# Patient Record
Sex: Female | Born: 1950 | Race: White | Hispanic: No | Marital: Married | Smoking: Current every day smoker
Health system: Southern US, Community
[De-identification: ages and names within clinical notes are randomized; demographics above are authoritative.]

## PROBLEM LIST (undated history)

## (undated) DIAGNOSIS — I10 Essential (primary) hypertension: Secondary | ICD-10-CM

## (undated) HISTORY — PX: CARDIAC SURGERY: SHX584

---

## 2015-09-29 ENCOUNTER — Emergency Department (HOSPITAL_BASED_OUTPATIENT_CLINIC_OR_DEPARTMENT_OTHER): Payer: 59

## 2015-09-29 ENCOUNTER — Encounter (HOSPITAL_BASED_OUTPATIENT_CLINIC_OR_DEPARTMENT_OTHER): Payer: Self-pay | Admitting: Emergency Medicine

## 2015-09-29 ENCOUNTER — Emergency Department (HOSPITAL_BASED_OUTPATIENT_CLINIC_OR_DEPARTMENT_OTHER)
Admission: EM | Admit: 2015-09-29 | Discharge: 2015-09-29 | Disposition: A | Discharge status: Home / Self Care | Payer: 59 | Attending: Emergency Medicine | Admitting: Emergency Medicine

## 2015-09-29 DIAGNOSIS — Y998 Other external cause status: Secondary | ICD-10-CM | POA: Insufficient documentation

## 2015-09-29 DIAGNOSIS — F17210 Nicotine dependence, cigarettes, uncomplicated: Secondary | ICD-10-CM | POA: Insufficient documentation

## 2015-09-29 DIAGNOSIS — Y9289 Other specified places as the place of occurrence of the external cause: Secondary | ICD-10-CM | POA: Diagnosis not present

## 2015-09-29 DIAGNOSIS — Z79899 Other long term (current) drug therapy: Secondary | ICD-10-CM | POA: Diagnosis not present

## 2015-09-29 DIAGNOSIS — W19XXXA Unspecified fall, initial encounter: Secondary | ICD-10-CM

## 2015-09-29 DIAGNOSIS — S52122A Displaced fracture of head of left radius, initial encounter for closed fracture: Secondary | ICD-10-CM | POA: Insufficient documentation

## 2015-09-29 DIAGNOSIS — Y9389 Activity, other specified: Secondary | ICD-10-CM | POA: Insufficient documentation

## 2015-09-29 DIAGNOSIS — S59902A Unspecified injury of left elbow, initial encounter: Secondary | ICD-10-CM | POA: Diagnosis present

## 2015-09-29 DIAGNOSIS — I10 Essential (primary) hypertension: Secondary | ICD-10-CM | POA: Insufficient documentation

## 2015-09-29 DIAGNOSIS — W010XXA Fall on same level from slipping, tripping and stumbling without subsequent striking against object, initial encounter: Secondary | ICD-10-CM | POA: Insufficient documentation

## 2015-09-29 HISTORY — DX: Essential (primary) hypertension: I10

## 2015-09-29 MED ORDER — HYDROCODONE-ACETAMINOPHEN 5-325 MG PO TABS
1.0000 | ORAL_TABLET | Freq: Once | ORAL | Status: AC
  Administered 2015-09-29: 1 via ORAL
  Filled 2015-09-29: qty 1

## 2015-09-29 MED ORDER — HYDROCODONE-ACETAMINOPHEN 5-325 MG PO TABS: 1.0000 | ORAL_TABLET | Freq: Four times a day (QID) | ORAL | Status: AC | PRN

## 2015-09-29 NOTE — ED Notes (Signed)
Pt reports that she tripped over her dog last night and fell onto right arm

## 2015-09-29 NOTE — Discharge Instructions (Signed)
X-ray show evidence of a left radial head fracture. Keep the sling on at all times. Take pain medicine as directed. Make an appointment to follow-up with sports medicine upstairs.

## 2015-09-29 NOTE — ED Provider Notes (Signed)
CSN: [id number]     Arrival date & time 09/29/15  1219 History   First MD Initiated Contact with Patient 09/29/15 1405     Chief Complaint  Patient presents with  . Fall     (Consider location/radiation/quality/duration/timing/severity/associated sxs/prior Treatment) Patient is a 64 y.o. female presenting with fall. The history is provided by the patient and the spouse.  Fall Pertinent negatives include no chest pain, no abdominal pain, no headaches and no shortness of breath.   patient status post fall last evening when she tripped over her dog. No loss of consciousness. Landed on her outstretched left arm immediate pain to the elbow area. Now has pain radiating from the elbow up to the shoulder and down to the hand. No other injuries. Pain is 10 out of 10. Difficult to move the arm at the elbow.  Past Medical History  Diagnosis Date  . Hypertension    Past Surgical History  Procedure Laterality Date  . Cardiac surgery     History reviewed. No pertinent family history. Social History  Substance Use Topics  . Smoking status: Current Every Day Smoker    Types: Cigarettes  . Smokeless tobacco: None  . Alcohol Use: No   OB History    No data available     Review of Systems  Constitutional: Negative for fever.  HENT: Negative for congestion.   Eyes: Negative for redness.  Respiratory: Negative for shortness of breath.   Cardiovascular: Negative for chest pain.  Gastrointestinal: Negative for abdominal pain.  Genitourinary: Negative for dysuria.  Musculoskeletal: Negative for back pain and neck pain.  Skin: Negative for rash.  Neurological: Negative for headaches.  Hematological: Does not bruise/bleed easily.  Psychiatric/Behavioral: Negative for confusion.      Allergies  Review of patient's allergies indicates no known allergies.  Home Medications   Prior to Admission medications   Medication Sig Start Date End Date Taking? Authorizing Provider  lisinopril  (PRINIVIL,ZESTRIL) 10 MG tablet Take 10 mg by mouth daily.   Yes Historical Provider, MD  HYDROcodone-acetaminophen (NORCO/VICODIN) 5-325 MG tablet Take 1-2 tablets by mouth every 6 (six) hours as needed for moderate pain. 09/29/15   Vanetta Mulders, MD   BP 141/80 mmHg  Pulse 91  Temp(Src) 97.8 F (36.6 C) (Oral)  Resp 18  Ht  (1.575 m)  Wt 170 lb (77.111 kg)  BMI 31.09 kg/m2  SpO2 98% Physical Exam  Constitutional: She is oriented to person, place, and time. She appears well-developed and well-nourished. No distress.  HENT:  Head: Normocephalic and atraumatic.  Mouth/Throat: Oropharynx is clear and moist.  Eyes: Conjunctivae and EOM are normal. Pupils are equal, round, and reactive to light.  Neck: Normal range of motion. Neck supple.  Cardiovascular: Normal rate, regular rhythm and normal heart sounds.   No murmur heard. Pulmonary/Chest: Effort normal and breath sounds normal. No respiratory distress.  Abdominal: Soft. Bowel sounds are normal. There is no tenderness.  Musculoskeletal: Normal range of motion. She exhibits tenderness.  Decreased range of motion at the elbow. Tender to palpation predominantly over the radial head. No evidence of any effusion. No deformity. Shoulder nontender no deformity. Wrist nontender no deformity no snuffbox tenderness. Radial pulses 2+. Cap refill to the fingers is 2 seconds. Sensations intact.  Neurological: She is alert and oriented to person, place, and time. No cranial nerve deficit. She exhibits normal muscle tone.  Skin: Skin is warm. No rash noted. No erythema.  Nursing note and vitals reviewed.  ED Course  Procedures (including critical care time) Labs Review Labs Reviewed - No data to display  Imaging Review Dg Elbow Complete Left  09/29/2015  CLINICAL DATA:  Status post fall over left arm with elbow pain. EXAM: LEFT ELBOW - COMPLETE 3+ VIEW COMPARISON:  None. FINDINGS: There is a slightly displaced fracture of the radial  head with intra-articular extension. No significant angulation There is an associated joint effusion. IMPRESSION: Intraarticular minimally displaced fracture of the radial head with associated joint effusion. Electronically Signed   By: Ted Mcalpineobrinka  Dimitrova M.D.   On: 09/29/2015 13:32   Dg Wrist Complete Left  09/29/2015  CLINICAL DATA:  Fall with left wrist pain EXAM: LEFT WRIST - COMPLETE 3+ VIEW COMPARISON:  None. FINDINGS: No fracture, dislocation or suspicious focal osseous lesion. Mild first carpometacarpal joint osteoarthritis. No pathologic soft tissue calcifications. IMPRESSION: No fracture or malalignment in the left wrist . Electronically Signed   By: Delbert PhenixJason A Poff M.D.   On: 09/29/2015 13:43   I have personally reviewed and evaluated these images and lab results as part of my medical decision-making.   EKG Interpretation None      MDM   Final diagnoses:  Fall, initial encounter  Radial head fracture, closed, left, initial encounter    Patient status post fall last evening tripped over her dog landed on outstretched left arm. Patient with tenderness around the elbow radiates to the shoulder and to the wrist. No snuffbox tenderness. X-rays of the left wrist were negative x-rays of the right elbow show evidence of a radial head fracture. Will be treated with sling pain medication and follow-up with sports medicine. Neurovascularly intact distally.    Vanetta MuldersScott Terryann Verbeek, MD 09/29/15 1422

## 2019-01-20 ENCOUNTER — Institutional Professional Consult (permissible substitution): Payer: Self-pay | Admitting: Pulmonary Disease

## 2019-01-26 ENCOUNTER — Institutional Professional Consult (permissible substitution): Payer: Self-pay | Admitting: Emergency Medicine

## 2019-01-31 ENCOUNTER — Ambulatory Visit (INDEPENDENT_AMBULATORY_CARE_PROVIDER_SITE_OTHER): Payer: Medicare HMO

## 2019-01-31 ENCOUNTER — Institutional Professional Consult (permissible substitution): Payer: Self-pay | Admitting: Internal Medicine

## 2019-01-31 ENCOUNTER — Ambulatory Visit (INDEPENDENT_AMBULATORY_CARE_PROVIDER_SITE_OTHER): Payer: Medicare HMO | Admitting: Internal Medicine

## 2019-01-31 ENCOUNTER — Other Ambulatory Visit: Payer: Self-pay

## 2019-01-31 VITALS — BP 118/66 | HR 104 | Temp 98.6°F | Ht 62.25 in | Wt 188.0 lb

## 2019-01-31 DIAGNOSIS — F17210 Nicotine dependence, cigarettes, uncomplicated: Secondary | ICD-10-CM

## 2019-01-31 DIAGNOSIS — R05 Cough: Secondary | ICD-10-CM

## 2019-01-31 DIAGNOSIS — J449 Chronic obstructive pulmonary disease, unspecified: Secondary | ICD-10-CM

## 2019-01-31 DIAGNOSIS — I10 Essential (primary) hypertension: Secondary | ICD-10-CM | POA: Diagnosis not present

## 2019-01-31 DIAGNOSIS — R058 Other specified cough: Secondary | ICD-10-CM

## 2019-01-31 MED ORDER — BUDESONIDE-FORMOTEROL FUMARATE 160-4.5 MCG/ACT IN AERO: 2.0000 | INHALATION_SPRAY | Freq: Two times a day (BID) | RESPIRATORY_TRACT | 0 refills | Status: DC

## 2019-01-31 MED ORDER — VALSARTAN 80 MG PO TABS: 80.0000 mg | ORAL_TABLET | Freq: Every day | ORAL | 11 refills | Status: AC

## 2019-01-31 MED ORDER — BUDESONIDE-FORMOTEROL FUMARATE 160-4.5 MCG/ACT IN AERO: 2.0000 | INHALATION_SPRAY | Freq: Two times a day (BID) | RESPIRATORY_TRACT | 11 refills | Status: AC

## 2019-01-31 MED ORDER — OMEPRAZOLE 40 MG PO CPDR: DELAYED_RELEASE_CAPSULE | ORAL | 2 refills | Status: AC

## 2019-01-31 NOTE — Progress Notes (Signed)
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      History of Present Illness  01/31/2019  Pulmonary/ 1st office eval/Ayo Guarino on BREO 200 plus neb qid plus acei  Chief Complaint  Patient presents with  . Wheezing    Got sick in January. Went to [facility]. Started as a bad cold, but kept getting worse. Hard to lay down at night to sleep.  Dyspnea:  50 ft since acute flare in Jan 2020 - prior to this says was baseline  = MMRC1 = can walk nl pace, flat grade, can't hurry or go uphills or steps s sob   Cough: cough/ min green in am then mostly clear  Sleep: lie flat on 2 pillows, used to better on recliner / has choking/ smothering if flat SABA use: 2-3 x a week and rarely needed albuterol neb / now 4 x daily    No obvious day to day or daytime variability or assoc   mucus plugs or hemoptysis or cp or chest tightness, subjective wheeze or overt sinus or hb symptoms.     Also denies any obvious fluctuation of symptoms with weather or environmental changes or other aggravating or alleviating factors except as outlined above   No unusual exposure hx or h/o childhood pna/ asthma or knowledge of premature birth.  Current Allergies, Complete Past Medical History, Past Surgical History, Family History, and Social History were reviewed in [employer] record.  ROS  The following are not active complaints unless bolded Hoarseness, sore throat, dysphagia, dental problems, itching, sneezing,  nasal  congestion or discharge of excess mucus or purulent secretions, ear ache,   fever, chills, sweats, unintended wt loss or wt gain, classically pleuritic or exertional cp,  orthopnea pnd or arm/hand swelling  or leg swelling, presyncope, palpitations, abdominal pain, anorexia, nausea, vomiting, diarrhea  or change in bowel habits or change in bladder habits, change in stools or change in urine, dysuria, hematuria,  rash, arthralgias, visual complaints, headache, numbness, weakness or ataxia or problems with walking or coordination,  change in mood = anxious or  memory.              Past Medical History:  Diagnosis Date  . Hypertension     Outpatient Medications Prior to Visit  Medication Sig Dispense Refill  . albuterol (PROVENTIL HFA;VENTOLIN HFA) 108 (90 Base) MCG/ACT inhaler Inhale into the lungs.    Marland Kitchen albuterol (PROVENTIL) (2.5 MG/3ML) 0.083% nebulizer solution Inhale into the lungs.    . ALPRAZolam (XANAX) 1 MG tablet Take 0.5 mg by mouth 3 (three) times daily.    Marland Kitchen aspirin EC 81 MG tablet Take 1 tablet by mouth daily.    . fluticasone furoate-vilanterol (BREO ELLIPTA) 100-25 MCG/INH AEPB Inhale into the lungs.    Marland Kitchen HYDROcodone-acetaminophen (NORCO/VICODIN) 5-325 MG tablet Take 1-2 tablets by mouth every 6 (six) hours as needed for moderate pain. 20 tablet 0  . lisinopril (  PRINIVIL,ZESTRIL) 2.5 MG tablet Take 2.5 mg by mouth daily.     . metFORMIN (GLUCOPHAGE) 500 MG tablet Take 1 tablet by mouth as needed.    . metoprolol succinate (TOPROL-XL) 25 MG 24 hr tablet Take 25 mg by mouth daily.     . montelukast (SINGULAIR) 10 MG tablet Take 10 mg by mouth daily.     . Multiple Vitamin (MULTIVITAMIN) tablet Take by mouth.    . Omega-3 1000 MG CAPS Take by mouth daily.     Marland Kitchen omeprazole (PRILOSEC) 40 MG capsule Take 40 mg by mouth daily.     . predniSONE (DELTASONE) 20 MG tablet Take 1 tablet by mouth 2 (two) times daily.    . rosuvastatin (CRESTOR) 40 MG tablet Take 40 mg by mouth daily.      Marland Kitchen lisinopril (PRINIVIL,ZESTRIL) 10 MG tablet Take 10 mg by mouth daily.        Objective:     BP 118/66 (BP Location: Left Arm, Patient Position: Sitting, Cuff Size: Normal)   Pulse (!) 104   Temp 98.6 F (37 C)   Ht 5' 2.25" (1.581 m)   Wt 188 lb (85.3 kg)   SpO2 95%   BMI 34.11 kg/m   SpO2: 95 % RA   Obese amb wf with classic pseudoewheeze/ dry upper airway pattern cough     HEENT: nl dentition / oropharynx. Nl external ear canals without cough reflex -  Mild bilateral non-specific turbinate edema     NECK :  without JVD/Nodes/TM/ nl carotid upstrokes bilaterally   LUNGS: no acc muscle use,  Mild barrel  contour chest wall with bilateral  Distant bs s audible wheeze and  without cough on insp or exp maneuver and mild  Hyperresonant  to  percussion bilaterally     CV:  RRR  no s3 or murmur or increase in P2, and no edema   ABD: mod obse soft and nontender with pos late insp Hoover's  in the supine position. No bruits or organomegaly appreciated, bowel sounds nl  MS:   Nl gait/  ext warm without deformities, calf tenderness, cyanosis or clubbing No obvious joint restrictions   SKIN: warm and dry without lesions    NEURO:  alert, approp, nl sensorium with  no motor or cerebellar deficits apparent.      CXR PA and Lateral:   01/31/2019 :    I personally reviewed images   impression as follows:   Minimal copd changes       Assessment   No problem-specific Assessment & Plan notes found for this encounter.     Sandrea Hughs, MD 01/31/2019

## 2019-01-31 NOTE — Patient Instructions (Addendum)
Stop lisinopril today and start diovan 80 mg daily in its place and you gradually improve over the next few weeks   Take Prilosec 40 mg Take 30- 60 min before your first and last meals of the day   Stop Breo   GERD (REFLUX)  is an extremely common cause of respiratory symptoms just like yours , many times with no obvious heartburn at all.    It can be treated with medication, but also with lifestyle changes including elevation of the head of your bed (ideally with 6 -8inch blocks under the headboard of your bed),  Smoking cessation, avoidance of late meals, excessive alcohol, and avoid fatty foods, chocolate, peppermint, colas, red wine, and acidic juices such as orange juice.  NO MINT OR MENTHOL PRODUCTS SO NO COUGH DROPS  USE SUGARLESS CANDY INSTEAD (Jolley ranchers or Stover's or Life Savers) or even ice chips will also do - the key is to swallow to prevent all throat clearing. NO OIL BASED VITAMINS - use powdered substitutes.  Avoid fish oil when coughing.    Plan A = Automatic = Symbicort 160 Take 2 puffs first thing in am and then another 2 puffs about 12 hours later.   Work on inhaler technique:  relax and gently blow all the way out then take a nice smooth deep breath back in, triggering the inhaler at same time you start breathing in.  Hold for up to 5 seconds if you can. Blow out thru nose. Rinse and gargle with water when done   Plan B = Backup Only use your albuterol inhaler (ventolin) as a rescue medication to be used if you can't catch your breath by resting or doing a relaxed purse lip breathing pattern.  - The less you use it, the better it will work when you need it. - Ok to use the inhaler up to 2 puffs  every 4 hours if you must but call for appointment if use goes up over your usual need - Don't leave home without it !!  (think of it like the spare tire for your car)   Plan C = Crisis - only use your albuterol nebulizer if you first try Plan B and it fails to help > ok  to use the nebulizer up to every 4 hours but if start needing it regularly call for immediate appointment   The key is to stop smoking completely before smoking completely stops you! - For smoking cessation advice/ classes call [phone]       Please schedule a follow up visit in 4 months but call sooner if needed  with all medications /inhalers/ solutions in hand so we can verify exactly what you are taking. This includes all medications from all doctors and over the counters

## 2019-01-31 NOTE — Progress Notes (Signed)
Spoke with pt and notified of results per Dr. Wert. Pt verbalized understanding and denied any questions. 

## 2019-02-01 ENCOUNTER — Telehealth: Payer: Self-pay | Admitting: Internal Medicine

## 2019-02-01 ENCOUNTER — Encounter: Payer: Self-pay | Admitting: Internal Medicine

## 2019-02-01 DIAGNOSIS — F17210 Nicotine dependence, cigarettes, uncomplicated: Secondary | ICD-10-CM | POA: Insufficient documentation

## 2019-02-01 DIAGNOSIS — R058 Other specified cough: Secondary | ICD-10-CM | POA: Insufficient documentation

## 2019-02-01 DIAGNOSIS — R05 Cough: Secondary | ICD-10-CM | POA: Insufficient documentation

## 2019-02-01 DIAGNOSIS — I10 Essential (primary) hypertension: Secondary | ICD-10-CM | POA: Insufficient documentation

## 2019-02-01 NOTE — Telephone Encounter (Signed)
Remind her that I used the analogy  of matches and gasoline - once we get rid of all the fuel for the fire it should all go away and only way to know is to try my recs for up to 4 weeks before any changes feasible

## 2019-02-01 NOTE — Assessment & Plan Note (Signed)
D/c ACEi and Breo 01/31/2019   Upper airway cough syndrome (previously labeled PNDS),  is so named because it's frequently impossible to sort out how much is  CR/sinusitis with freq throat clearing (which can be related to primary GERD)   vs  causing  secondary (" extra esophageal")  GERD from wide swings in gastric pressure that occur with throat clearing, often  promoting self use of mint and menthol lozenges that reduce the lower esophageal sphincter tone and exacerbate the problem further in a cyclical fashion.   These are the same pts (now being labeled as having "irritable larynx syndrome" by some cough centers) who not infrequently have a history of having failed to tolerate ace inhibitors,  dry powder inhalers or biphosphonates or report having atypical/extraesophageal reflux symptoms that don't respond to standard doses of PPI  and are easily confused as having aecopd or asthma flares by even experienced allergists/ pulmonologists (myself included).   Try max rx for gerd/ off breo and acei then regroup in one m if not better, in July for pfts if improve

## 2019-02-01 NOTE — Telephone Encounter (Signed)
Instructions   Stop lisinopril today and start diovan 80 mg daily in its place and you gradually improve over the next few weeks   Take Prilosec 40 mg Take 30- 60 min before your first and last meals of the day   Stop Breo   GERD (REFLUX)  is an extremely common cause of respiratory symptoms just like yours , many times with no obvious heartburn at all.    It can be treated with medication, but also with lifestyle changes including elevation of the head of your bed (ideally with 6 -8inch blocks under the headboard of your bed),  Smoking cessation, avoidance of late meals, excessive alcohol, and avoid fatty foods, chocolate, peppermint, colas, red wine, and acidic juices such as orange juice.  NO MINT OR MENTHOL PRODUCTS SO NO COUGH DROPS  USE SUGARLESS CANDY INSTEAD (Jolley ranchers or Stover's or Life Savers) or even ice chips will also do - the key is to swallow to prevent all throat clearing. NO OIL BASED VITAMINS - use powdered substitutes.  Avoid fish oil when coughing.    Plan A = Automatic = Symbicort 160 Take 2 puffs first thing in am and then another 2 puffs about 12 hours later.   Work on inhaler technique:  relax and gently blow all the way out then take a nice smooth deep breath back in, triggering the inhaler at same time you start breathing in.  Hold for up to 5 seconds if you can. Blow out thru nose. Rinse and gargle with water when done   Plan B = Backup Only use your albuterol inhaler (ventolin) as a rescue medication to be used if you can't catch your breath by resting or doing a relaxed purse lip breathing pattern.  - The less you use it, the better it will work when you need it. - Ok to use the inhaler up to 2 puffs  every 4 hours if you must but call for appointment if use goes up over your usual need - Don't leave home without it !!  (think of it like the spare tire for your car)   Plan C = Crisis - only use your albuterol nebulizer if you first try Plan B  and it fails to help > ok to use the nebulizer up to every 4 hours but if start needing it regularly call for immediate appointment   The key is to stop smoking completely before smoking completely stops you! - For smoking cessation advice/ classes call [phone]       Please schedule a follow up visit in 4 months but call sooner if needed  with all medications /inhalers/ solutions in hand so we can verify exactly what you are taking. This includes all medications from all doctors and over the counters         ____________________________  Jeanene Erb and spoke with patient she stated that she saw MW yesterday for a consult. Per patient she stated that a lot of her medications were being changed but she never got a full answer as to why she takes so long to heal when she gets a cold or allergy flare up. She also wants to know why it goes down to her lungs.   MW please advise, thank you.

## 2019-02-01 NOTE — Assessment & Plan Note (Signed)
4-5 min discussion re active cigarette smoking in addition to office E&M  Ask about tobacco use:   ongoing Advise quitting   I took an extended  opportunity with this patient to outline the consequences of continued cigarette use  in airway disorders based on all the data we have from the multiple national lung health studies (perfomed over decades at millions of dollars in cost)  indicating that smoking cessation, not choice of inhalers or physicians, is the most important aspect of care.   Assess willingness:  Not committed at this point Assist in quit attempt:  Per PCP when ready Arrange follow up:   Follow up per Primary Care planned  For smoking cessation classes call [phone]      Total time devoted to counseling  > 50 % of initial 60 min office visit:  review case with pt/   device teaching which extended face to face time for this visit/ discussion of options/alternatives/ personally creating written customized instructions  in presence of pt  then going over those specific  Instructions directly with the pt including how to use all of the meds but in particular covering each new medication in detail and the difference between the maintenance= "automatic" meds and the prns using an action plan format for the latter (If this problem/symptom => do that organization reading Left to right).  Please see AVS from this visit for a full list of these instructions which I personally wrote for this pt and  are unique to this visit.

## 2019-02-01 NOTE — Assessment & Plan Note (Signed)
Active smoker - d/c acei 01/31/2019 due to strong suspicion of pseudoasthma - 01/31/2019  After extensive coaching inhaler device,  effectiveness =    75% with hfa so try symbicort 160 2bid and stop breo 200   She has gone from [facility] to 3-4 since Jan 2020 and only improves a little bit with pred, remains neb dep c/w very difficult to control airway problems   DDX of  difficult airways management almost all start with A and  include Adherence, Ace Inhibitors, Acid Reflux, Active Sinus Disease, Alpha 1 Antitripsin deficiency, Anxiety masquerading as Airways dz,  ABPA,  Allergy(esp in young), Aspiration (esp in elderly), Adverse effects of meds,  Active smoking or vaping, A bunch of PE's (a small clot burden can't cause this syndrome unless there is already severe underlying pulm or vascular dz with poor reserve) plus two Bs  = Bronchiectasis and Beta blocker use..and one C= CHF   Adherence is always the initial "prime suspect" and is a multilayered concern that requires a "trust but verify" approach in every patient - starting with knowing how to use medications, especially inhalers, correctly, keeping up with refills and understanding the fundamental difference between maintenance and prns vs those medications only taken for a very short course and then stopped and not refilled.  - see hfa teaching - return with all meds in hand using a trust but verify approach to confirm accurate Medication  Reconciliation The principal here is that until we are certain that the  patients are doing what we've asked, it makes no sense to ask them to do more.   ACEi adverse effects at the  top of the usual list of suspects and the only way to rule it out is a trial off > see a/p    Active smoking also up there > see sep a/p  ? Acid (or non-acid) GERD > always difficult to exclude as up to 75% of pts in some series report no assoc GI/ Heartburn symptoms> rec max (24h)  acid suppression and diet restrictions/ reviewed  and instructions given in writing.   ? Anxiety > usually at the bottom of this list of usual suspects but should be much higher on this pt's based on H and P and note already on psychotropics and may interfere with adherence and also interpretation of response or lack thereof to symptom management which can be quite subjective.   ? Allergy/ asthma component > continue singulair, use high dose ICS in hfa form, taper off pred  ? Adverse effects of DPI > d/c BREO   Due to coronovirus infection, need to wait to schedule routine pfts in f/u in July 2020 but we'll see her sooner prn if not doing a lot better

## 2019-02-01 NOTE — Telephone Encounter (Signed)
Pt advised of Dr.Wert's recommendations.

## 2019-02-01 NOTE — Assessment & Plan Note (Signed)
In the best review of chronic cough to date ( NEJM 2016 375 1544-1551) ,  ACEi are now felt to cause cough in up to  20% of pts which is a 4 fold increase from previous reports and does not include the variety of non-specific complaints we see in pulmonary clinic in pts on ACEi but previously attributed to another dx like  Copd/asthma and  include PNDS, throat and chest congestion, "bronchitis", unexplained dyspnea and noct "strangling" sensations, and hoarseness, but also  atypical /refractory GERD symptoms like dysphagia and "bad heartburn"   The only way I know  to prove this is not an "ACEi Case" is a trial off ACEi x a minimum of 6 weeks then regroup.   Try diovan 80 mg daily  

## 2020-11-11 IMAGING — DX CHEST - 2 VIEW
2 series · 2 of 2 positions shown · non-contrast
Comparison: 11/28/2018

CLINICAL DATA: Difficulty breathing

EXAM:
CHEST - 2 VIEW

[chest pa]
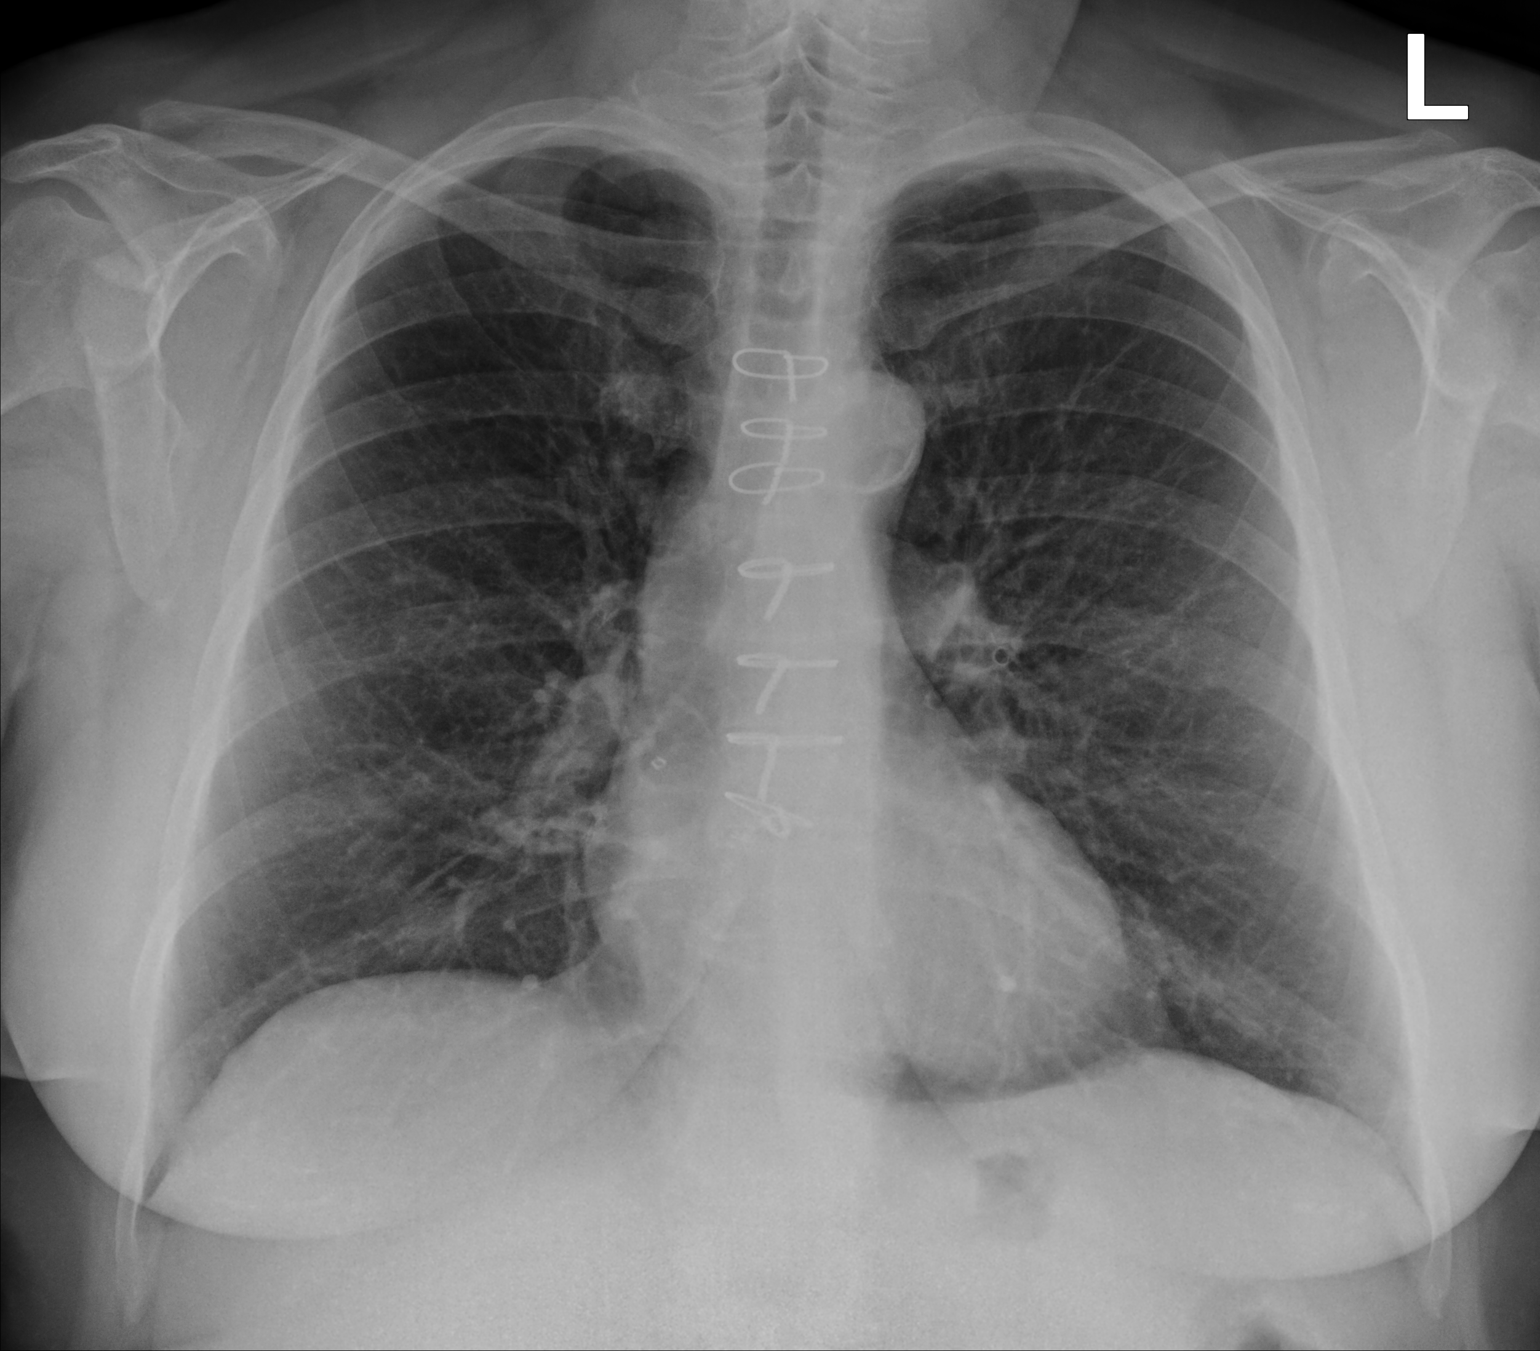

[chest lat]
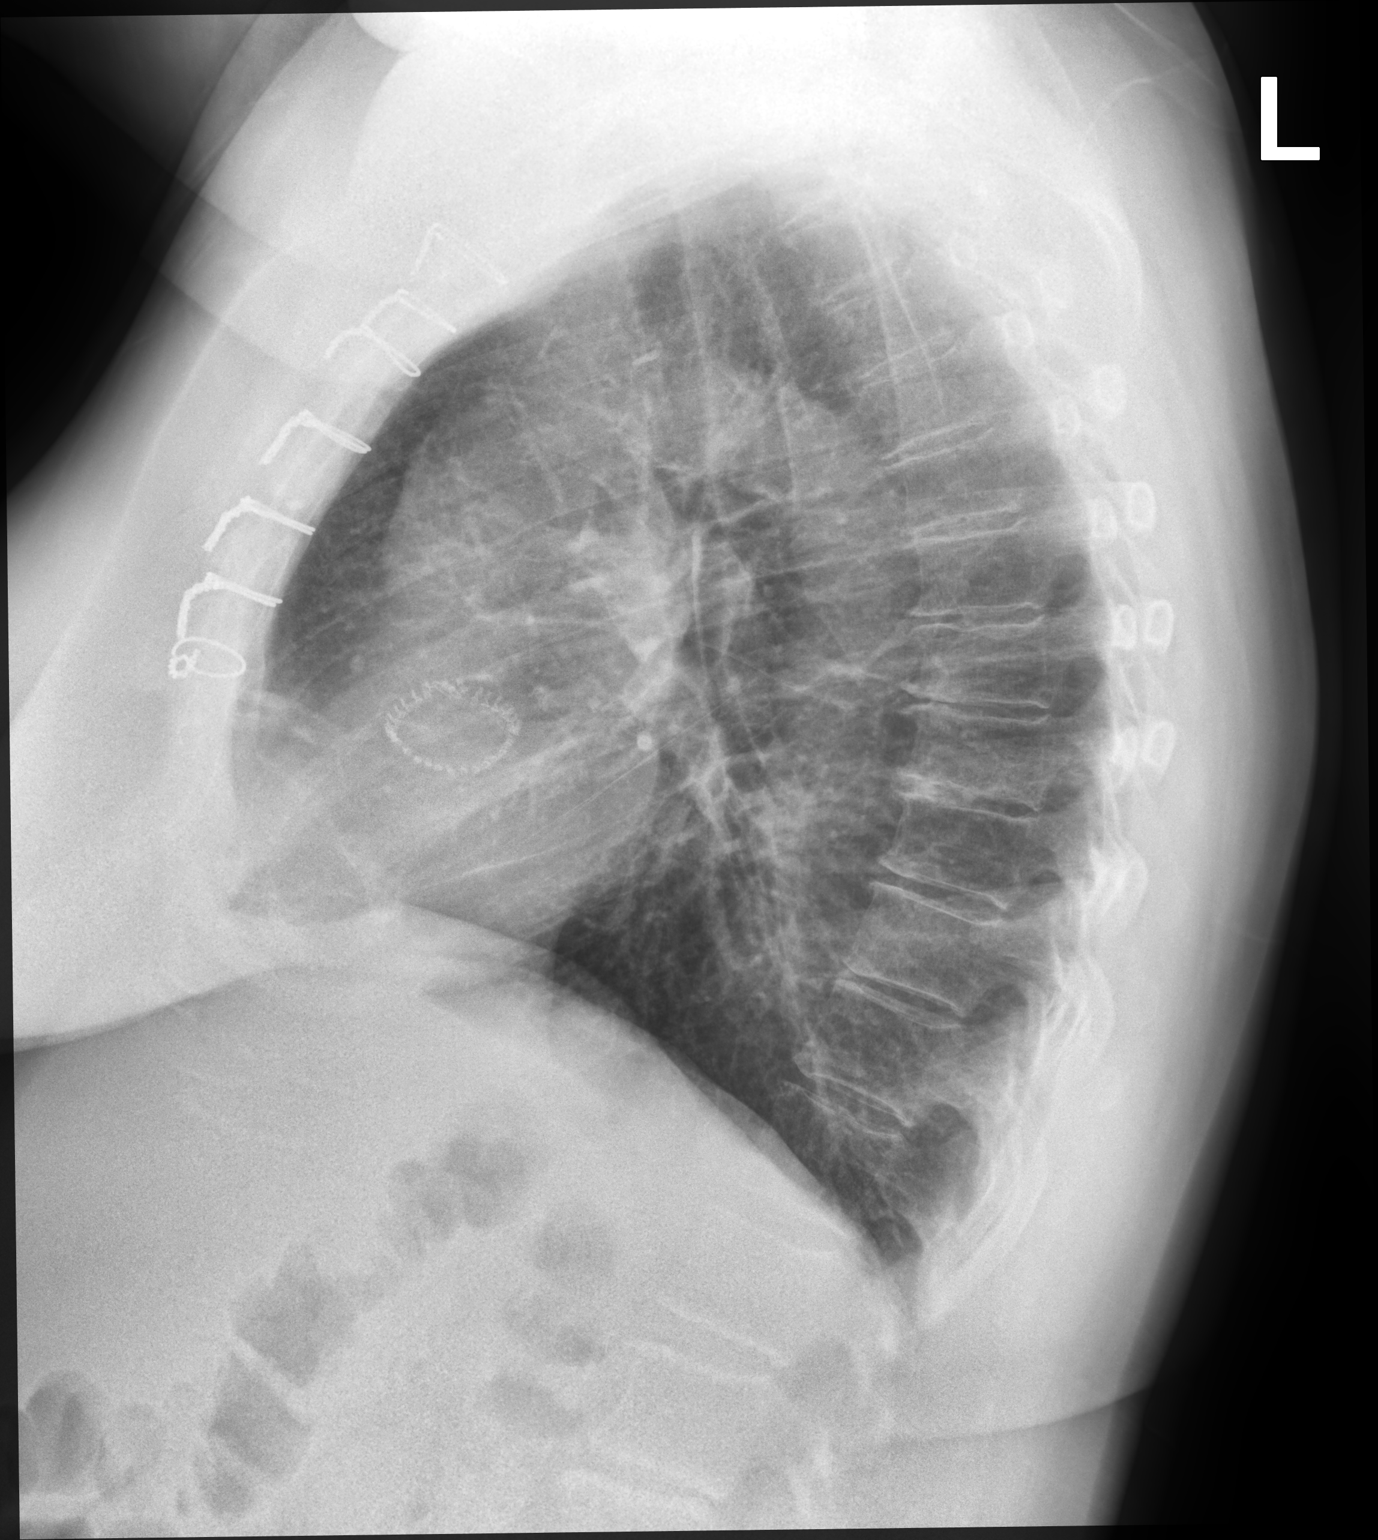

[2 of 2 positions shown; findings below may reference images not displayed]

FINDINGS: Lungs are clear.  No pleural effusion or pneumothorax.

The heart is normal in size.  Prosthetic valve.

Mild degenerative changes of the visualized thoracolumbar spine.
Median sternotomy.
IMPRESSION: Normal chest radiographs.

## 2022-09-09 ENCOUNTER — Institutional Professional Consult (permissible substitution): Payer: Medicare HMO | Admitting: Pulmonary Disease
